# Patient Record
Sex: Female | Born: 1965 | Hispanic: No | Smoking: Never smoker
Health system: Southern US, Community
[De-identification: ages and names within clinical notes are randomized; demographics above are authoritative.]

---

## 2018-04-29 ENCOUNTER — Telehealth (INDEPENDENT_AMBULATORY_CARE_PROVIDER_SITE_OTHER): Payer: Self-pay | Admitting: Radiology

## 2018-04-29 NOTE — Telephone Encounter (Signed)
Called and left voicemail asking patient to call us back to answer pre screening questions for appointment on 4/7 

## 2018-04-30 ENCOUNTER — Ambulatory Visit (INDEPENDENT_AMBULATORY_CARE_PROVIDER_SITE_OTHER): Payer: BLUE CROSS/BLUE SHIELD

## 2018-04-30 ENCOUNTER — Encounter (INDEPENDENT_AMBULATORY_CARE_PROVIDER_SITE_OTHER): Payer: Self-pay | Admitting: Orthopaedic Surgery

## 2018-04-30 ENCOUNTER — Other Ambulatory Visit: Payer: Self-pay

## 2018-04-30 ENCOUNTER — Ambulatory Visit (INDEPENDENT_AMBULATORY_CARE_PROVIDER_SITE_OTHER): Payer: BLUE CROSS/BLUE SHIELD | Admitting: Orthopaedic Surgery

## 2018-04-30 DIAGNOSIS — M25561 Pain in right knee: Secondary | ICD-10-CM

## 2018-04-30 MED ORDER — METHYLPREDNISOLONE ACETATE 40 MG/ML IJ SUSP
40.0000 mg | INTRAMUSCULAR | Status: AC | PRN
  Administered 2018-04-30: 11:00:00 40 mg via INTRA_ARTICULAR

## 2018-04-30 MED ORDER — LIDOCAINE HCL 1 % IJ SOLN
3.0000 mL | INTRAMUSCULAR | Status: AC | PRN
  Administered 2018-04-30: 11:00:00 3 mL

## 2018-04-30 NOTE — Progress Notes (Signed)
Office Visit Note   Patient: Kristin Baldwin           Date of Birth: 1965-04-22           MRN: [id number] Visit Date: 04/30/2018              Requested by: No referring provider defined for this encounter. PCP: Patient, No Pcp Per   Assessment & Plan: Visit Diagnoses:  1. Right knee pain, unspecified chronicity     Plan: Recommend that she use ice on the knee.  Also work on quad strengthening exercises that Kristin Baldwin had her demonstrate these back today.  She will follow-up in 2 weeks check her response if she is doing well she can always call and cancel the appointment.  Questions were encouraged and answered by Kristin Baldwin and myself.  Follow-Up Instructions: Return in about 2 weeks (around 05/14/2018).   Orders:  Orders Placed This Encounter  Procedures   Large Joint Inj   XR Knee 1-2 Views Right   No orders of the defined types were placed in this encounter.     Procedures: Large Joint Inj: R knee on 04/30/2018 11:06 AM Indications: pain Details: 22 G 1.5 in needle, anterolateral approach  Arthrogram: No  Medications: 3 mL lidocaine 1 %; 40 mg methylPREDNISolone acetate 40 MG/ML Outcome: tolerated well, no immediate complications Procedure, treatment alternatives, risks and benefits explained, specific risks discussed. Consent was given by the patient. Immediately prior to procedure a time out was called to verify the correct patient, procedure, equipment, support staff and site/side marked as required. Patient was prepped and draped in the usual sterile fashion.       Clinical Data: No additional findings.   Subjective: Chief Complaint  Patient presents with   Right Knee - Pain    HPI Kristin Baldwin comes into today due to an acute onset of right knee pain.  Knee pain began after she was walking her dog in February and pulled her causing her to twist her knee.  She had pain medial aspect of the knee.  She notes that she has decreased range of motion particularly  flexion of the right knee.  Knee does feel like it catches at times but no giving way painful popping.  She notes swelling the right knee compared to left.  No previous pain in the knee or injury to the right knee.  She is tried soaking the knee otherwise no medications or braces. Review of Systems Please see HPI.  Negative for fever chills shortness breath chest pain  Objective: Vital Signs: There were no vitals taken for this visit.  Physical Exam Constitutional:      Appearance: She is not ill-appearing or diaphoretic.  Pulmonary:     Effort: Pulmonary effort is normal.  Neurological:     Mental Status: She is alert and oriented to person, place, and time.  Psychiatric:        Mood and Affect: Mood normal.        Behavior: Behavior normal.     Ortho Exam Bilateral knees full extension left knee flexion to 115 degrees right knee flexion to 105 degrees.  No instability valgus varus stressing of either knee.  McMurray's positive on the right negative on the left.  Anterior drawer right knee is negative.  Right knee no effusion abnormal warmth erythema.  Tenderness along medial joint line of the right knee only. Specialty Comments:  No specialty comments available.  Imaging: Xr Knee 1-2 Views Right  Result Date: 04/30/2018 AP lateral views right knee: No acute fractures.  Medial lateral joint line and patellofemoral compartment all well preserved.  Knee is well located.    PMFS History: There are no active problems to display for this patient.  No past medical history on file.  No family history on file.   Social History   Occupational History   Not on file  Tobacco Use   Smoking status: Not on file  Substance and Sexual Activity   Alcohol use: Not on file   Drug use: Not on file   Sexual activity: Not on file

## 2018-05-14 ENCOUNTER — Other Ambulatory Visit: Payer: Self-pay

## 2018-05-14 ENCOUNTER — Encounter (INDEPENDENT_AMBULATORY_CARE_PROVIDER_SITE_OTHER): Payer: Self-pay | Admitting: Orthopaedic Surgery

## 2018-05-14 ENCOUNTER — Ambulatory Visit (INDEPENDENT_AMBULATORY_CARE_PROVIDER_SITE_OTHER): Payer: BLUE CROSS/BLUE SHIELD | Admitting: Orthopaedic Surgery

## 2018-05-14 DIAGNOSIS — M25561 Pain in right knee: Secondary | ICD-10-CM | POA: Diagnosis not present

## 2018-05-14 NOTE — Progress Notes (Signed)
The patient is coming today with continued significant right knee pain all along the medial aspect of her knee.  We saw her 2 weeks ago for this.  Her x-rays were unremarkable but she was continued to have significant medial joint line tenderness.  She points to the medial aspect of her knee from the medial femoral condyle down to the medial tibial plateau as a source of her pain.  We did place a steroid injection in her knee at the last visit and she said this did not help her a single better at all.  On examination of her right knee there is no effusion.  She has significant pain along the medial aspect of her knee but the knee feels ligamentously stable but is very painful to her only stressed the medial compartment and the medial structures around her knee.  She says it does catch at night when she is in bed and locks up and then she can straighten her knee.  Given the fact that she is having mechanical symptoms in her right knee and given the fact that conservative treatment is not helped at all including injections, anti-inflammatories, activity modification, time and quad training exercises, I feel that it is prudent and medically necessary to obtain an MRI of her right knee to rule out an internal derangement.  We will try a hinged knee brace for now.  We will see her back after she has the MRI of her knee.

## 2018-05-15 ENCOUNTER — Other Ambulatory Visit (INDEPENDENT_AMBULATORY_CARE_PROVIDER_SITE_OTHER): Payer: Self-pay

## 2018-05-15 DIAGNOSIS — G8929 Other chronic pain: Secondary | ICD-10-CM

## 2018-05-15 DIAGNOSIS — M25561 Pain in right knee: Principal | ICD-10-CM

## 2018-06-03 ENCOUNTER — Ambulatory Visit
Admission: RE | Admit: 2018-06-03 | Discharge: 2018-06-03 | Disposition: A | Discharge status: Home / Self Care | Payer: BLUE CROSS/BLUE SHIELD | Source: Ambulatory Visit | Attending: Orthopaedic Surgery | Admitting: Orthopaedic Surgery

## 2018-06-03 ENCOUNTER — Other Ambulatory Visit: Payer: Self-pay

## 2018-06-03 DIAGNOSIS — G8929 Other chronic pain: Secondary | ICD-10-CM

## 2018-06-05 ENCOUNTER — Ambulatory Visit: Payer: BLUE CROSS/BLUE SHIELD | Admitting: Orthopaedic Surgery

## 2018-06-05 ENCOUNTER — Encounter: Payer: Self-pay | Admitting: Orthopaedic Surgery

## 2018-06-05 ENCOUNTER — Other Ambulatory Visit: Payer: Self-pay

## 2018-06-05 VITALS — Ht 65.0 in | Wt 170.0 lb

## 2018-06-05 DIAGNOSIS — M542 Cervicalgia: Secondary | ICD-10-CM

## 2018-06-05 DIAGNOSIS — M25561 Pain in right knee: Secondary | ICD-10-CM | POA: Diagnosis not present

## 2018-06-05 MED ORDER — METHYLPREDNISOLONE ACETATE 40 MG/ML IJ SUSP
40.0000 mg | INTRAMUSCULAR | Status: AC | PRN
  Administered 2018-06-05: 40 mg via INTRAMUSCULAR

## 2018-06-05 MED ORDER — LIDOCAINE HCL 1 % IJ SOLN
1.0000 mL | INTRAMUSCULAR | Status: AC | PRN
  Administered 2018-06-05: 1 mL

## 2018-06-05 MED ORDER — DICLOFENAC SODIUM 1 % TD GEL: 4.0000 g | Freq: Four times a day (QID) | TRANSDERMAL | 1 refills | Status: AC

## 2018-06-05 NOTE — Progress Notes (Signed)
   Office Visit Note   Patient: Kristin Baldwin           Date of Birth: Jun 10, 1965           MRN: [id number] Visit Date: 06/05/2018              Requested by: No referring provider defined for this encounter. PCP: Patient, No Pcp Per   Assessment & Plan: Visit Diagnoses:  1. Acute pain of right knee     Plan: We will send her to physical therapy for quad strengthening modalities and home exercise program.  Also have her apply Voltaren gel up to 4 g 4 times daily to the right knee area of maximal tenderness.  See back in a month to see what type of response she had therapy the injection and the use of diclofenac gel.  Follow-Up Instructions: Return in about 1 month (around 07/06/2018).   Orders:  No orders of the defined types were placed in this encounter.  Meds ordered this encounter  Medications  . diclofenac sodium (VOLTAREN) 1 % GEL    Sig: Apply 4 g topically 4 (four) times daily. Apply to right knee    Dispense:  1 Tube    Refill:  1      Procedures: Trigger Point Inj Date/Time: 06/05/2018 1:24 PM Performed by: Kirtland Bouchard, PA-C Authorized by: Kirtland Bouchard, PA-C   Consent Given by:  Patient Site marked: the procedure site was marked   Timeout: prior to procedure the correct patient, procedure, and site was verified   Indications:  Pain Total # of Trigger Points:  1 Location: lower extremity   Approach:  Medial Medications #1:  1 mL lidocaine 1 %; 40 mg methylPREDNISolone acetate 40 MG/ML     Clinical Data: No additional findings.   Subjective: Chief Complaint  Patient presents with  . Right Knee - Pain, Follow-up    MRI Right Knee Review    HPI Mrs. Charo returns today to go over the MRI of her right knee.  She states her knee locks up still.  She has a pulling sensation in the knee primarily the medial aspect of the knee.  Today she points to the Pes anserine area. MRI images are reviewed with the patient.  This shows no meniscal tear.  Some  mild medial compartmental arthritic changes.  No ligamentous injuries.  Enchondroma of the medial femoral condyle.  No acute fracture.  Review of Systems No fevers or chills.  Objective: Vital Signs: Ht 5\' 5"  (1.651 m)   Wt 170 lb (77.1 kg)   BMI 28.29 kg/m   Physical Exam General: Well-developed well-nourished female no acute distress Ortho Exam Right knee no tenderness along medial lateral joint line.  No abnormal warmth erythema or effusion.  She has good range of motion of the knee.  Tenderness over the Pes anserines region. Specialty Comments:  No specialty comments available.  Imaging: No results found.   PMFS History: There are no active problems to display for this patient.  History reviewed. No pertinent past medical history.  History reviewed. No pertinent family history.  History reviewed. No pertinent surgical history. Social History   Occupational History  . Not on file  Tobacco Use  . Smoking status: Never Smoker  . Smokeless tobacco: Never Used  Substance and Sexual Activity  . Alcohol use: Not Currently  . Drug use: Not on file  . Sexual activity: Not on file

## 2018-07-08 ENCOUNTER — Ambulatory Visit (INDEPENDENT_AMBULATORY_CARE_PROVIDER_SITE_OTHER): Payer: BLUE CROSS/BLUE SHIELD | Admitting: Physician Assistant

## 2018-07-08 ENCOUNTER — Other Ambulatory Visit: Payer: Self-pay

## 2018-07-08 ENCOUNTER — Encounter: Payer: Self-pay | Admitting: Physician Assistant

## 2018-07-08 DIAGNOSIS — M25561 Pain in right knee: Secondary | ICD-10-CM

## 2018-07-08 NOTE — Progress Notes (Signed)
HPI: Kristin Baldwin returns today follow-up of her right knee.  She states that her right knee is much improved since the cortisone injection.  She is been doing quad strengthening exercises as shown.  She is only having occasional giving way of the knee but feels that this is also improving.  Plan: We will not charge patient for office visit today.  She understands that she has some arthritic changes are mild medial compartment of the right knee and that she may require occasional cortisone injection or possibly a supplemental injection in the future.  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  She will let us know if her condition changes or becomes worse.  Questions encouraged and answered.

## 2019-12-19 IMAGING — MR MRI OF THE RIGHT KNEE WITHOUT CONTRAST
4 of 6 series · 22 of 40 positions shown · non-contrast
Comparison: Right knee x-rays dated April 30, 2018.

CLINICAL DATA: Persistent medial knee pain since fall in [REDACTED].

EXAM:
MRI OF THE RIGHT KNEE WITHOUT CONTRAST
TECHNIQUE: Multiplanar, multisequence MR imaging of the knee was performed. No
intravenous contrast was administered.

[Series 3: T2 fat-sat · axial · 4.0mm · 0.50mm/px · z∈[-89,+20]mm · 5 of 28 slices shown (1 of 2)]
[im 1/28]
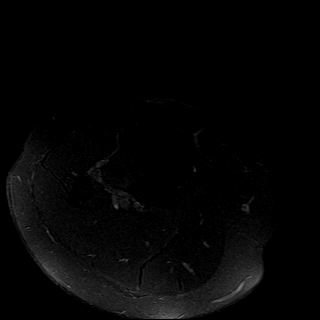
[im 5/28]
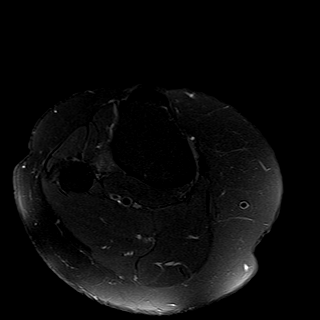
[im 10/28]
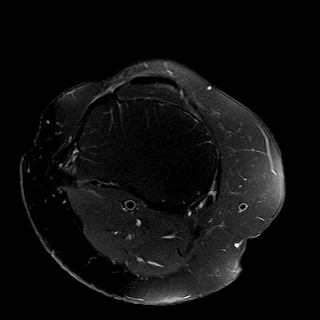
[im 14/28]
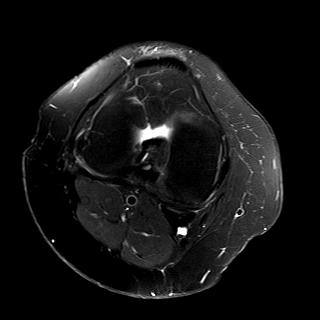
[im 23/28]
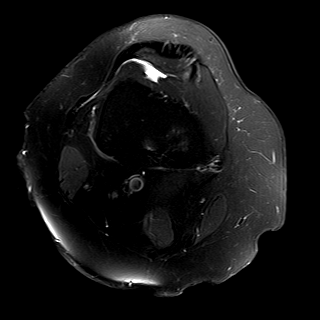

[Series 5: T2 fat-sat · coronal · 4.0mm · 0.31mm/px · 3 of 25 slices shown (2 of 2)]
[im 5/25]
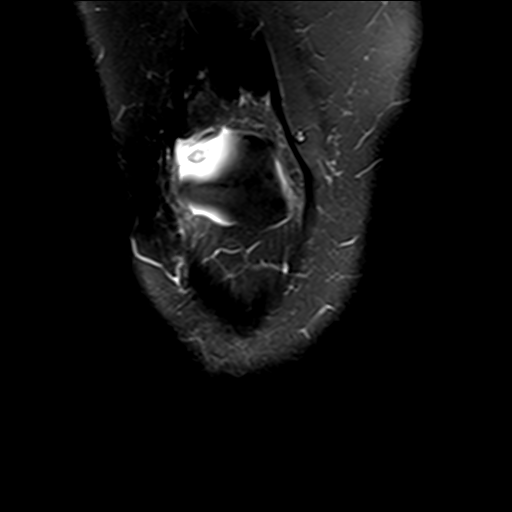
[im 15/25]
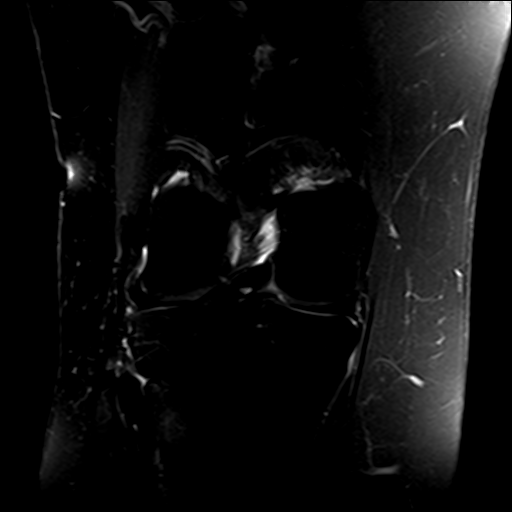
[im 25/25]
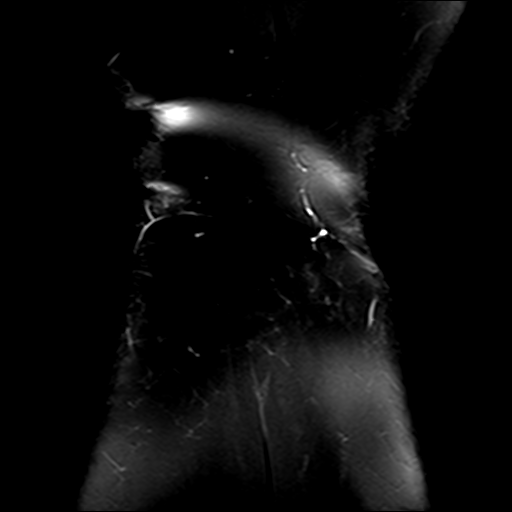

[Series 6: PD fat-sat · coronal · 3.0mm · 0.31mm/px · 7 of 30 slices shown (1 of 2)]
[im 1/30]
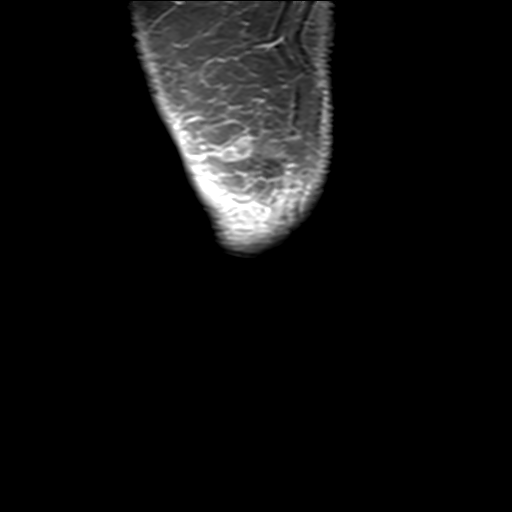
[im 5/30]
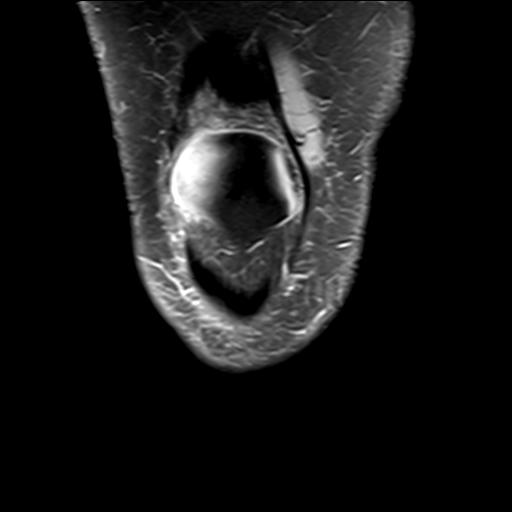
[im 10/30]
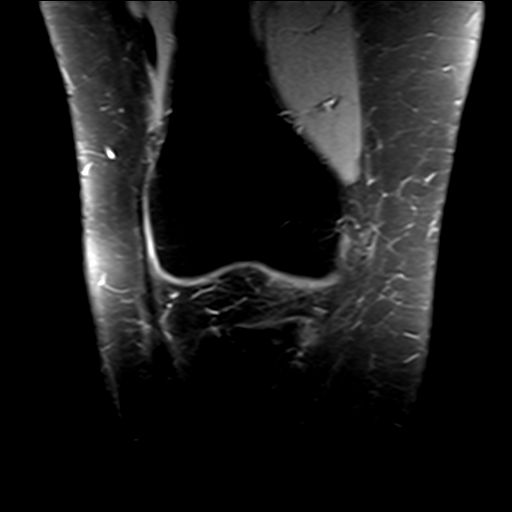
[im 15/30]
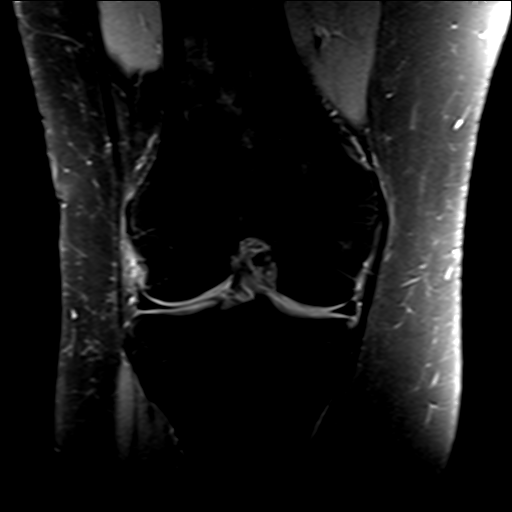
[im 20/30]
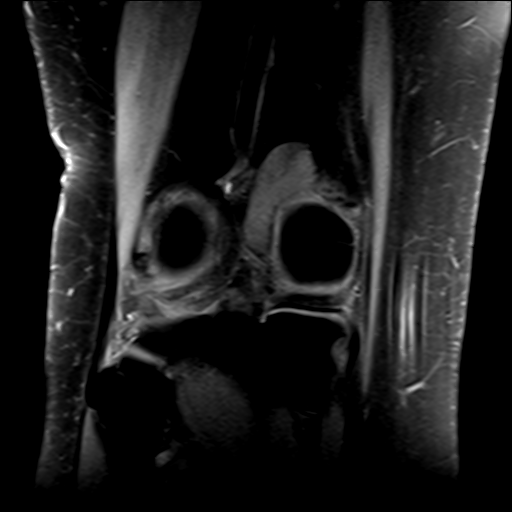
[im 25/30]
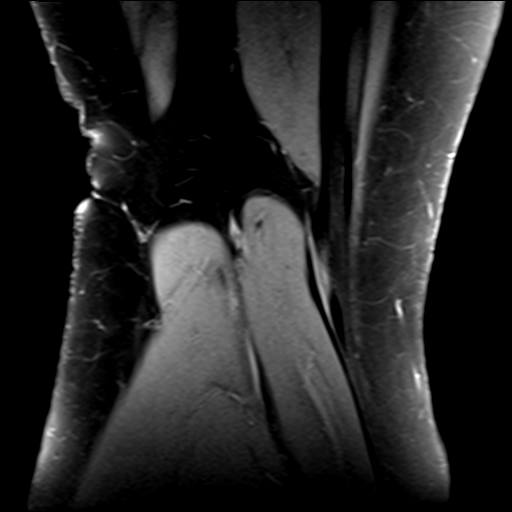
[im 30/30]
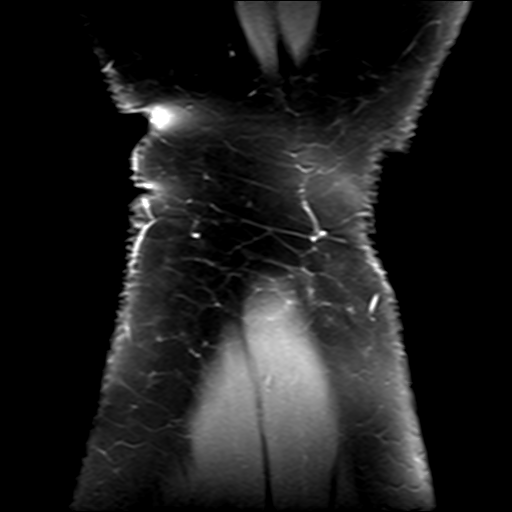

[Series 8: PD fat-sat · sagittal · 3.0mm · 0.31mm/px · 7 of 30 slices shown (2 of 2)]
[im 1/30]
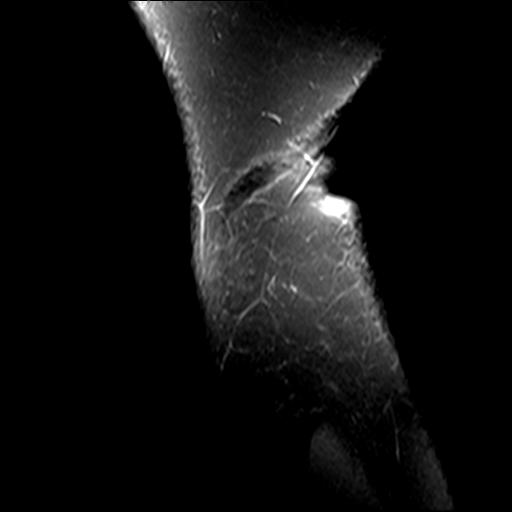
[im 5/30]
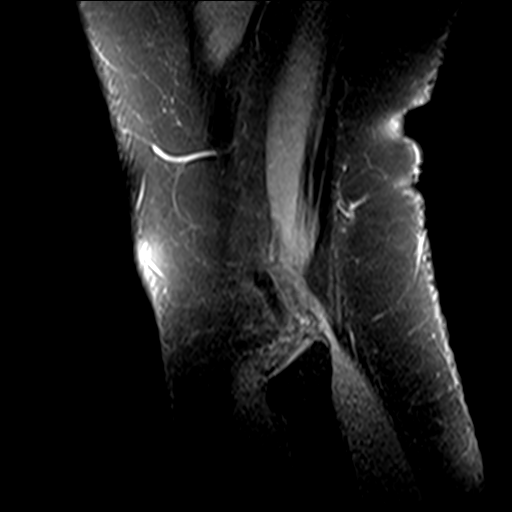
[im 10/30]
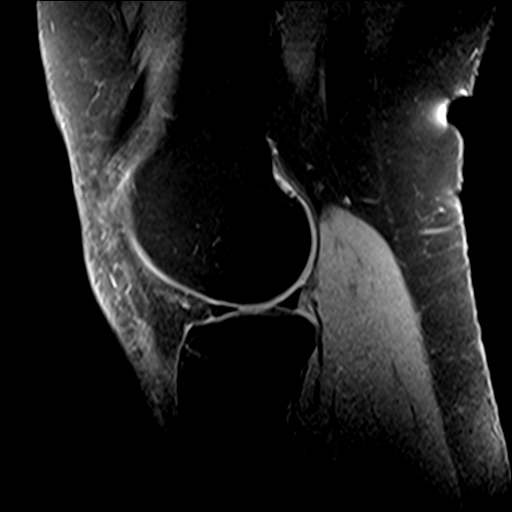
[im 15/30]
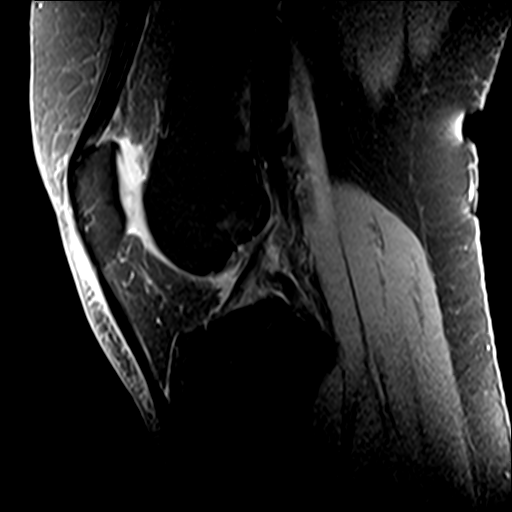
[im 20/30]
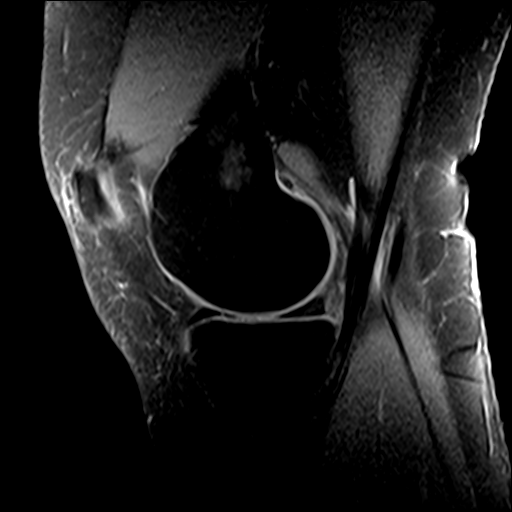
[im 25/30]
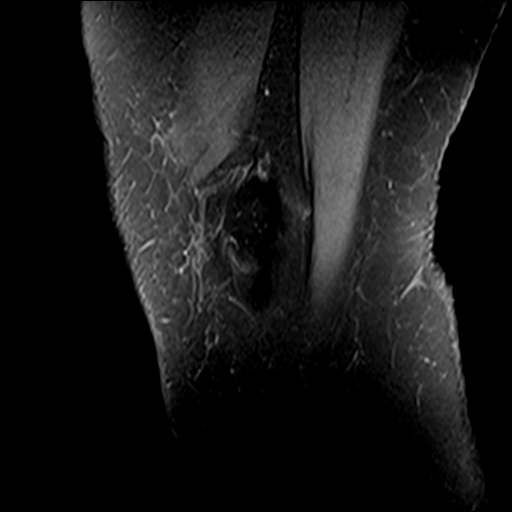
[im 30/30]
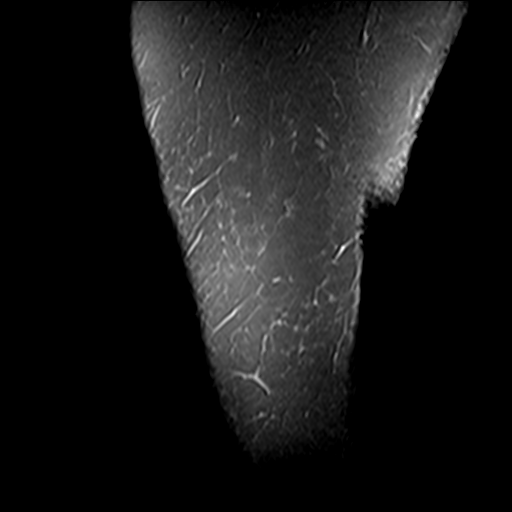

[22 of 40 positions shown; findings below may reference images not displayed]

FINDINGS: MENISCI

Medial meniscus: Intact. Degenerative signal in the posterior horn.

Lateral meniscus:  Intact.

LIGAMENTS

Cruciates:  Intact ACL and PCL.

Collaterals: Medial collateral ligament is intact. Lateral
collateral ligament complex is intact.

CARTILAGE

Patellofemoral:  No chondral defect.

Medial: Mild thinning and superficial irregularity along the central
weight-bearing medial femoral condyle and medial tibial plateau.

Lateral:  No chondral defect.

Joint: No significant joint effusion. Normal Hoffa's fat. No plical
thickening.

Popliteal Fossa:  Tiny Baker cyst.  Intact popliteus tendon.

Extensor Mechanism: Intact quadriceps tendon and patellar tendon.
Intact medial and lateral patellar retinaculum. Intact MPFL.

Bones: No acute fracture or dislocation. Lobulated, well-defined
cm lesion in the medial femoral condyle, most consistent with an
enchondroma.

Other: None.
IMPRESSION: 1. No meniscal or ligamentous injury.
2. Early cartilage degeneration in the medial compartment.
3. 1.2 cm enchondroma in the medial femoral condyle.
